# Patient Record
Sex: Female | Born: 1940 | Race: Black or African American | Hispanic: No | Marital: Single | Smoking: Never smoker
Health system: Southern US, Community
[De-identification: ages and names within clinical notes are randomized; demographics above are authoritative.]

---

## 2018-06-27 ENCOUNTER — Encounter (HOSPITAL_COMMUNITY): Payer: Self-pay | Admitting: Emergency Medicine

## 2018-06-27 ENCOUNTER — Emergency Department (HOSPITAL_COMMUNITY)
Admission: EM | Admit: 2018-06-27 | Discharge: 2018-06-28 | Disposition: A | Discharge status: Other Acute Inpatient Hospital | Payer: Commercial Managed Care - PPO | Attending: Emergency Medicine | Admitting: Emergency Medicine

## 2018-06-27 DIAGNOSIS — F418 Other specified anxiety disorders: Secondary | ICD-10-CM | POA: Diagnosis present

## 2018-06-27 DIAGNOSIS — R45851 Suicidal ideations: Secondary | ICD-10-CM

## 2018-06-27 DIAGNOSIS — F410 Panic disorder [episodic paroxysmal anxiety] without agoraphobia: Secondary | ICD-10-CM | POA: Diagnosis not present

## 2018-06-27 DIAGNOSIS — Z79899 Other long term (current) drug therapy: Secondary | ICD-10-CM | POA: Diagnosis not present

## 2018-06-27 DIAGNOSIS — F322 Major depressive disorder, single episode, severe without psychotic features: Secondary | ICD-10-CM | POA: Diagnosis not present

## 2018-06-27 DIAGNOSIS — K219 Gastro-esophageal reflux disease without esophagitis: Secondary | ICD-10-CM | POA: Diagnosis not present

## 2018-06-27 LAB — URINALYSIS, ROUTINE W REFLEX MICROSCOPIC
Bacteria, UA: NONE SEEN
Bilirubin Urine: NEGATIVE
GLUCOSE, UA: NEGATIVE mg/dL
Hgb urine dipstick: NEGATIVE
KETONES UR: 20 mg/dL — AB
Nitrite: NEGATIVE
PH: 5 (ref 5.0–8.0)
Protein, ur: 30 mg/dL — AB
Specific Gravity, Urine: 1.025 (ref 1.005–1.030)

## 2018-06-27 LAB — CBC WITH DIFFERENTIAL/PLATELET
BASOS ABS: 0 10*3/uL (ref 0.0–0.1)
BASOS PCT: 0 %
Eosinophils Absolute: 0 10*3/uL (ref 0.0–0.7)
Eosinophils Relative: 0 %
HEMATOCRIT: 41.9 % (ref 36.0–46.0)
HEMOGLOBIN: 13.4 g/dL (ref 12.0–15.0)
LYMPHS PCT: 30 %
Lymphs Abs: 1.8 10*3/uL (ref 0.7–4.0)
MCH: 28.4 pg (ref 26.0–34.0)
MCHC: 32 g/dL (ref 30.0–36.0)
MCV: 88.8 fL (ref 78.0–100.0)
MONO ABS: 0.5 10*3/uL (ref 0.1–1.0)
Monocytes Relative: 9 %
NEUTROS ABS: 3.8 10*3/uL (ref 1.7–7.7)
NEUTROS PCT: 61 %
Platelets: 294 10*3/uL (ref 150–400)
RBC: 4.72 MIL/uL (ref 3.87–5.11)
RDW: 13.9 % (ref 11.5–15.5)
WBC: 6.2 10*3/uL (ref 4.0–10.5)

## 2018-06-27 LAB — COMPREHENSIVE METABOLIC PANEL
ALBUMIN: 4.1 g/dL (ref 3.5–5.0)
ALT: 16 U/L (ref 0–44)
AST: 25 U/L (ref 15–41)
Alkaline Phosphatase: 70 U/L (ref 38–126)
Anion gap: 10 (ref 5–15)
BILIRUBIN TOTAL: 0.8 mg/dL (ref 0.3–1.2)
BUN: 14 mg/dL (ref 8–23)
CO2: 29 mmol/L (ref 22–32)
CREATININE: 0.74 mg/dL (ref 0.44–1.00)
Calcium: 9.7 mg/dL (ref 8.9–10.3)
Chloride: 104 mmol/L (ref 98–111)
GFR calc Af Amer: >60 mL/min (ref >60)
GLUCOSE: 106 mg/dL — AB (ref 70–99)
POTASSIUM: 3.5 mmol/L (ref 3.5–5.1)
Sodium: 143 mmol/L (ref 135–145)
TOTAL PROTEIN: 7.4 g/dL (ref 6.5–8.1)

## 2018-06-27 LAB — RAPID URINE DRUG SCREEN, HOSP PERFORMED
Amphetamines: NOT DETECTED
Benzodiazepines: NOT DETECTED
COCAINE: NOT DETECTED
Opiates: NOT DETECTED
TETRAHYDROCANNABINOL: NOT DETECTED

## 2018-06-27 LAB — ETHANOL: Alcohol, Ethyl (B): <10 mg/dL (ref <10)

## 2018-06-27 LAB — CBG MONITORING, ED: Glucose-Capillary: 106 mg/dL — ABNORMAL HIGH (ref 70–99)

## 2018-06-27 MED ORDER — HYDROXYZINE HCL 25 MG PO TABS: 25.0000 mg | ORAL_TABLET | Freq: Four times a day (QID) | ORAL | Status: DC | PRN

## 2018-06-27 NOTE — ED Notes (Signed)
EDP at bedside  

## 2018-06-27 NOTE — BH Assessment (Signed)
[facility] Assessment Progress Note      [facility] for review.

## 2018-06-27 NOTE — ED Notes (Signed)
Visitor at bedside.

## 2018-06-27 NOTE — ED Notes (Signed)
Specimen cup provided

## 2018-06-27 NOTE — ED Notes (Signed)
Report given to Cynthia,RN in [facility].

## 2018-06-27 NOTE — ED Notes (Signed)
Pt to room #28. Pt pleasant on approach. Pt reports increase in anxiety and nervousness. Pt reports this has been ongoing for the past two months. encouragement and support provided. Will continue to monitor.

## 2018-06-27 NOTE — ED Notes (Signed)
TTS at bedside. 

## 2018-06-27 NOTE — ED Provider Notes (Signed)
[facility]-EMERGENCY DEPT Provider Note   CSN: [id number] Arrival date & time: 06/27/18  1311     History   Chief Complaint Chief Complaint  Patient presents with  . Panic Attack    HPI Kristen Hodges is a 77 y.o. female.  This is a 77 year old female presents with several months of increasing anxiety and panic attacks.  Also notes worsening depression.  Patient also has noted hand tremors when she becomes anxious.  These are relieved when she prays.  States that she has lots of issues going on at home.  Denies any current suicidal or homicidal ideations.  She has not been hallucinating.  Has no prior psychiatric history.  Notes a decreased oral intake but good sleep.  Denies any use of alcohol or tobacco.  Symptoms have been gradually worse over the past few weeks.  No treatment used prior to arrival     History reviewed. No pertinent past medical history.  There are no active problems to display for this patient.   History reviewed. No pertinent surgical history.   OB History   None      Home Medications    Prior to Admission medications   Medication Sig Start Date End Date Taking? Authorizing Provider  Chlorpheniramine Maleate (ALLERGY PO) Take 1 tablet by mouth daily.   Yes [provider]  esomeprazole (NEXIUM) 20 MG capsule Take 20 mg by mouth daily at 12 noon.   Yes [provider]  OVER THE COUNTER MEDICATION Place 1 drop into both eyes daily as needed (ITCHY EYES).   Yes [provider]    Family History No family history on file.  Social History Social History   Tobacco Use  . Smoking status: Never Smoker  . Smokeless tobacco: Never Used  Substance Use Topics  . Alcohol use: Never    Frequency: Never  . Drug use: Never     Allergies   Patient has no allergy information on record.   Review of Systems Review of Systems  All other systems reviewed and are negative.    Physical Exam Updated  Vital Signs BP (!) 109/57 (BP Location: Left Arm)   Pulse 87   Temp (!) 94.2 F (34.6 C) (Oral)   Resp 18   SpO2 97%   Physical Exam  Constitutional: She is oriented to person, place, and time. She appears well-developed and well-nourished.  Non-toxic appearance. No distress.  HENT:  Head: Normocephalic and atraumatic.  Eyes: Pupils are equal, round, and reactive to light. Conjunctivae, EOM and lids are normal.  Neck: Normal range of motion. Neck supple. No tracheal deviation present. No thyroid mass present.  Cardiovascular: Normal rate, regular rhythm and normal heart sounds. Exam reveals no gallop.  No murmur heard. Pulmonary/Chest: Effort normal and breath sounds normal. No stridor. No respiratory distress. She has no decreased breath sounds. She has no wheezes. She has no rhonchi. She has no rales.  Abdominal: Soft. Normal appearance and bowel sounds are normal. She exhibits no distension. There is no tenderness. There is no rebound and no CVA tenderness.  Musculoskeletal: Normal range of motion. She exhibits no edema or tenderness.  Neurological: She is alert and oriented to person, place, and time. She has normal strength. No cranial nerve deficit or sensory deficit. GCS eye subscore is 4. GCS verbal subscore is 5. GCS motor subscore is 6.  Skin: Skin is warm and dry. No abrasion and no rash noted.  Psychiatric: She has a normal mood  and affect. Her speech is normal and behavior is normal. She expresses no suicidal plans and no homicidal plans.  Nursing note and vitals reviewed.    ED Treatments / Results  Labs (all labs ordered are listed, but only abnormal results are displayed) Labs Reviewed  URINE CULTURE  ETHANOL  RAPID URINE DRUG SCREEN, HOSP PERFORMED  CBC WITH DIFFERENTIAL/PLATELET  COMPREHENSIVE METABOLIC PANEL  URINALYSIS, ROUTINE W REFLEX MICROSCOPIC  CBG MONITORING, ED    EKG None  Radiology No results found.  Procedures Procedures (including critical  care time)  Medications Ordered in ED Medications - No data to display   Initial Impression / Assessment and Plan / ED Course  I have reviewed the triage vital signs and the nursing notes.  Pertinent labs & imaging results that were available during my care of the patient were reviewed by me and considered in my medical decision making (see chart for details).     Patient medically clear for psychiatric disposition  Final Clinical Impressions(s) / ED Diagnoses   Final diagnoses:  None    ED Discharge Orders    None       Lorre Nick, MD 06/27/18 1529

## 2018-06-27 NOTE — ED Triage Notes (Signed)
Patient here from home with complaints of increased panic attacks. States all of these attacks started 2 weeks ago and increased. Shaky. Reports that "I just pray and sometimes it get better".

## 2018-06-27 NOTE — ED Notes (Signed)
Pt sleeping at present, no distress noted, calm & cooperative at present.  Pt depressed at present.  A&O x 3, monitoring for safety, Q 15 min checks in effect.

## 2018-06-27 NOTE — BH Assessment (Addendum)
Assessment Note  Kristen Hodges is an [age] y.o. female who presented in the ED seeking help for her anxiety and depression.  Patient states that for the last year that she has been psychiatrically decompensating and states that she has not been able to rebound.  Patient states that she tends to get really stressed out and states that she holds things in and does not ask for help.  She states that her anxiety has been so high that she is constantly tremulous and states that she is drained and has no energy.  Patient states that she feels like it started last year when she was sick and had a persistent cough that would not go away.  At the same time, she states that she was sitting with an Alzheimer's patient and states that she allowed herself to get really stressed out.  Patient states that she "pushed things back in my mind and tried not to deal with it."  Patient states that she has been so down lately that she has prayed to die because she does not want to continue to have to live feeling this way.  Patient states that she has a difficult time mustering up enough energy to do anything and she states that she just does not feel normal anymore.  Patient lives alone and has a lot of property to take care of and she feels overwhelmed.  She states that she has a daughter who would most likely help her, but patient states that she does not want to bother her.  However, at the same time, by not getting help with things she states that she is becoming increasingly depressed. Patient states that she has lost her appetite and states that she has lost 20 lbs in the past few months.  She states that she is only sleeping four hours at a time.  While patient states that she does not have a blatant plan of how she would kill herself, she does state that she does not feel safe to leave the hospital and return home at present. She denies HI/Psychosis.  Patient states that she is divorced and states that she has one grown  daughter.  Patient states that she has a cousin that she is close to and states that her cousin keeps close tabs on her and she states, "we are close like sisters."  Kristen Hodges was in the room with patient with patient's permission and her cousin states that she has noticed a dramatic change in the patient recently and she feels like the patient is declining.  She thinks that the patient needs help with her depression.  Patient states that she has never had any mental illness treatment on an inpatient or outpatient basis.  Patient denies any history of substance use, but states that she feels like her brother was an alcoholic.  Patient denies any history of abuse and states that she has never been a self-mutilator.  Patient presents as oriented and alert.  Her memory appears to be intact.  She is pleasant and cooperative.  Her speech is clear, coherent.  Her thoughts are logical, but she appears to have some thought blocking.  Her anxiety is moderated to severe and her affect is flat.  She appears to be depressed and anxious and has a tremulous.  Her  Psycho-motor activity is restless.  She does not appear to be responding to any internal stimuli.  Patient states that she has just been feeling hopeless and helpless most recently and states  that she needs help to get her life back on track.       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 No pertinent past medical history.  History reviewed. No pertinent surgical history.  Family History: No family history on file.  Social History:  reports that she has never smoked. She has never used smokeless tobacco. She reports that she does not drink alcohol or use drugs.  Additional Social History:  Alcohol / Drug Use Pain Medications: denies Prescriptions: denies Over the Counter: denies History of alcohol / drug use?: No history of alcohol / drug abuse  CIWA: CIWA-Ar BP: 130/84 Pulse  Rate: 70 COWS:    Allergies: Not on File  Home Medications:  (Not in a hospital admission)  OB/GYN Status:  No LMP recorded. Patient is postmenopausal.  General Assessment Data Location of Assessment: [facility] ED TTS Assessment: In system Is this a Tele or Face-to-Face Assessment?: Face-to-Face Is this an Initial Assessment or a Re-assessment for this encounter?: Initial Assessment Marital status: Divorced Maiden name: Tiburcio Pea Is patient pregnant?: No Pregnancy Status: No Living Arrangements: Alone Can pt return to current living arrangement?: Yes Admission Status: Voluntary Is patient capable of signing voluntary admission?: Yes Referral Source: Self/Family/Friend Insurance type: Actor)     Crisis Care Plan Living Arrangements: Alone Legal Guardian: Other:(self) Name of Psychiatrist: (none) Name of Therapist: (none)  Education Status Is patient currently in school?: No Is the patient employed, unemployed or receiving disability?: Receiving disability income(retired)  Risk to self with the past 6 months Suicidal Ideation: Yes-Currently Present Has patient been a risk to self within the past 6 months prior to admission? : No Suicidal Intent: No Has patient had any suicidal intent within the past 6 months prior to admission? : No Is patient at risk for suicide?: Yes(patient states that she has been praying to die.) Suicidal Plan?: No Has patient had any suicidal plan within the past 6 months prior to admission? : No Access to Means: No What has been your use of drugs/alcohol within the last 12 months?: (none) Previous Attempts/Gestures: No How many times?: (0) Other Self Harm Risks: (lives alone, minimal support, high anxiety) Triggers for Past Attempts: None known Intentional Self Injurious Behavior: None Family Suicide History: No Recent stressful life event(s): Other (Comment)(general life changes as you get older) Persecutory voices/beliefs?: No Depression:  Yes Depression Symptoms: Despondent, Tearfulness, Isolating, Fatigue, Loss of interest in usual pleasures, Feeling worthless/self pity Substance abuse history and/or treatment for substance abuse?: No Suicide prevention information given to non-admitted patients: Not applicable  Risk to Others within the past 6 months Homicidal Ideation: No Does patient have any lifetime risk of violence toward others beyond the six months prior to admission? : No Thoughts of Harm to Others: No Current Homicidal Intent: No Current Homicidal Plan: No Access to Homicidal Means: No Identified Victim: none History of harm to others?: No Assessment of Violence: None Noted Violent Behavior Description: none Does patient have access to weapons?: No Criminal Charges Pending?: No Does patient have a court date: No Is patient on probation?: No  Psychosis Hallucinations: None noted Delusions: None noted  Mental Status Report Appearance/Hygiene: Unremarkable Eye Contact: Good Motor Activity: Tremors Speech: Logical/coherent Level of Consciousness: Alert Mood: Depressed, Anxious Affect: Anxious, Depressed Anxiety Level: Severe Thought Processes: Coherent, Relevant Judgement: Impaired Orientation: Person, Place, Time, Situation Obsessive Compulsive Thoughts/Behaviors: None  Cognitive Functioning Concentration: Normal Memory: Recent Intact, Remote Intact Is patient IDD:  No Is patient DD?: No Insight: Fair Impulse Control: Good Appetite: Poor Have you had any weight changes? : Loss Amount of the weight change? (lbs): 20 lbs Sleep: Decreased Total Hours of Sleep: 4 Vegetative Symptoms: None  ADLScreening [facility] Assessment Services) Patient's cognitive ability adequate to safely complete daily activities?: Yes Patient able to express need for assistance with ADLs?: Yes Independently performs ADLs?: Yes (appropriate for developmental age)  Prior Inpatient Therapy Prior Inpatient Therapy:  No  Prior Outpatient Therapy Prior Outpatient Therapy: No Does patient have an ACCT team?: No Does patient have Intensive In-House Services?  : No Does patient have [facility] services? : No Does patient have P4CC services?: No  ADL Screening (condition at time of admission) Patient's cognitive ability adequate to safely complete daily activities?: Yes Is the patient deaf or have difficulty hearing?: No Does the patient have difficulty seeing, even when wearing glasses/contacts?: No Does the patient have difficulty concentrating, remembering, or making decisions?: No Patient able to express need for assistance with ADLs?: Yes Does the patient have difficulty dressing or bathing?: No Independently performs ADLs?: Yes (appropriate for developmental age) Does the patient have difficulty walking or climbing stairs?: Yes Weakness of Legs: None Weakness of Arms/Hands: None     Therapy Consults (therapy consults require a physician order) PT Evaluation Needed: No OT Evalulation Needed: No SLP Evaluation Needed: No Abuse/Neglect Assessment (Assessment to be complete while patient is alone) Abuse/Neglect Assessment Can Be Completed: Yes Physical Abuse: Denies Verbal Abuse: Denies Sexual Abuse: Denies Exploitation of patient/patient's resources: Denies Self-Neglect: Denies Values / Beliefs Cultural Requests During Hospitalization: None Spiritual Requests During Hospitalization: None Consults Spiritual Care Consult Needed: No Social Work Consult Needed: No Merchant navy officer (For Healthcare) Does Patient Have a Medical Advance Directive?: Yes Type of Advance Directive: Living will, Healthcare Power of [employer] of Healthcare Power of Attorney in Chart?: No - copy requested Copy of Living Will in Chart?: No - copy requested Nutrition Screen- [facility] Adult/[facility] Has the patient recently lost weight without trying?: Yes, 14-23 lbs. Has the patient been eating poorly because of a  decreased appetite?: Yes Malnutrition Screening Tool Score: 3  Additional Information 1:1 In Past 12 Months?: No CIRT Risk: No Elopement Risk: No Does patient have medical clearance?: Yes     Disposition: Per Nanine Means, DNP, inpaient gero-psych placement is recommended. Disposition Initial Assessment Completed for this Encounter: Yes Disposition of Patient: Admit Type of inpatient treatment program: Adult  On Site Evaluation by:   Reviewed with Physician:    Arnoldo Lenis Talia Hoheisel 06/27/2018 5:44 PM

## 2018-06-28 ENCOUNTER — Emergency Department (HOSPITAL_COMMUNITY): Payer: Commercial Managed Care - PPO

## 2018-06-28 DIAGNOSIS — R45851 Suicidal ideations: Secondary | ICD-10-CM

## 2018-06-28 DIAGNOSIS — F418 Other specified anxiety disorders: Secondary | ICD-10-CM | POA: Diagnosis not present

## 2018-06-28 DIAGNOSIS — F410 Panic disorder [episodic paroxysmal anxiety] without agoraphobia: Secondary | ICD-10-CM | POA: Diagnosis not present

## 2018-06-28 LAB — TSH: TSH: 0.65 u[IU]/mL (ref 0.350–4.500)

## 2018-06-28 MED ORDER — PANTOPRAZOLE SODIUM 40 MG PO TBEC
40.0000 mg | DELAYED_RELEASE_TABLET | Freq: Every day | ORAL | Status: DC
  Administered 2018-06-28: 40 mg via ORAL
  Filled 2018-06-28: qty 1

## 2018-06-28 NOTE — Consult Note (Addendum)
[facility] Consult   Reason for Consult:  Severe anxiety Referring Physician:  EDP Patient Identification: Kristen Hodges MRN:  [id number] Principal Diagnosis: Depression with anxiety Diagnosis:   Patient Active Problem List   Diagnosis Date Noted  . Depression with anxiety [F41.8] 06/28/2018    Total Time spent with patient: 45 minutes  Subjective:   Kristen Hodges is a 77 y.o. female patient admitted with severe anxiety.  HPI:  Pt was seen and chart reviewed with treatment team and Dr Mariea Clonts. Pt stated she has had severe anxiety and panic attacks for the past month and the attacks just come out of nowhere. Pt stated she has never had anything like this before. Pt denies suicidal ideation and auditory/visual hallucinations but when asked if she wanted to harm anyone else she stated she did not know how to answer that one. Pt is calm and cooperative but did endorse multiple people she is close to have passed away recently. Pt lives by herself and has one daughter who helps her when needed. Pt takes no medications for any mental health needs and only takes Nexium for acid reflux. Pt does have a history of heart problems but due to her being from [place], [place] there are no EKG's to compare to. 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 All of Pt's other lab work is unremarkable. Will order TSH to rule out Thyroid issues that may be causing some of her anxiety. Pt is recommended for inpatient geropsychiatric admission to address her anxiety and depression. Pt is agreeable to this plan and has been accepted to [facility] by Dr Alonna Minium.   Past Psychiatric History: As above  Risk to Self: Suicidal Ideation: Yes-Currently Present Suicidal Intent: No Is patient at risk for suicide?: Yes(patient states that she has been praying to die.) Suicidal Plan?: No Access to Means: No What has been your use of  drugs/alcohol within the last 12 months?: (none) How many times?: (0) Other Self Harm Risks: (lives alone, minimal support, high anxiety) Triggers for Past Attempts: None known Intentional Self Injurious Behavior: None Risk to Others: Homicidal Ideation: No Thoughts of Harm to Others: No Current Homicidal Intent: No Current Homicidal Plan: No Access to Homicidal Means: No Identified Victim: none History of harm to others?: No Assessment of Violence: None Noted Violent Behavior Description: none Does patient have access to weapons?: No Criminal Charges Pending?: No Does patient have a court date: No Prior Inpatient Therapy: Prior Inpatient Therapy: No Prior Outpatient Therapy: Prior Outpatient Therapy: No Does patient have an ACCT team?: No Does patient have Intensive In-House Services?  : No Does patient have [facility] services? : No Does patient have P4CC services?: No  Past Medical History: History reviewed. No pertinent past medical history. History reviewed. No pertinent surgical history. Family History: No family history on file. Family Psychiatric  History: Son-BPAD Social History:  Social History   Substance and Sexual Activity  Alcohol Use Never  . Frequency: Never     Social History   Substance and Sexual Activity  Drug Use Never    Social History   Socioeconomic History  . Marital status: Single    Spouse name: Not on file  . Number of children: Not on file  . Years of education: Not on file  . Highest education level: Not on file  Occupational History  . Not on file  Social Needs  . Financial  resource strain: Not on file  . Food insecurity:    Worry: Not on file    Inability: Not on file  . Transportation needs:    Medical: Not on file    Non-medical: Not on file  Tobacco Use  . Smoking status: Never Smoker  . Smokeless tobacco: Never Used  Substance and Sexual Activity  . Alcohol use: Never    Frequency: Never  . Drug use: Never  . Sexual  activity: Not on file  Lifestyle  . Physical activity:    Days per week: Not on file    Minutes per session: Not on file  . Stress: Not on file  Relationships  . Social connections:    Talks on phone: Not on file    Gets together: Not on file    Attends religious service: Not on file    Active member of club or organization: Not on file    Attends meetings of clubs or organizations: Not on file    Relationship status: Not on file  Other Topics Concern  . Not on file  Social History Narrative  . Not on file   Additional Social History: She lives at home alone. She has one daughter and two grandsons. She denies illicit substance or alcohol use.     Allergies:  Not on File  Labs:  Results for orders placed or performed during the hospital encounter of 06/27/18 (from the past 48 hour(s))  POC CBG, ED     Status: Abnormal   Collection Time: 06/27/18  2:15 PM  Result Value Ref Range   Glucose-Capillary 106 (H) 70 - 99 mg/dL  Ethanol     Status: None   Collection Time: 06/27/18  2:35 PM  Result Value Ref Range   Alcohol, Ethyl (B) <10 <10 mg/dL    Comment: (NOTE) Lowest detectable limit for serum alcohol is 10 mg/dL. For medical purposes only. Performed at [facility], [place] [address] [place], [place] [address]   CBC with Differential/Platelet     Status: None   Collection Time: 06/27/18  2:35 PM  Result Value Ref Range   WBC 6.2 4.0 - 10.5 K/uL   RBC 4.72 3.87 - 5.11 MIL/uL   Hemoglobin 13.4 12.0 - 15.0 g/dL   HCT 41.9 36.0 - 46.0 %   MCV 88.8 78.0 - 100.0 fL   MCH 28.4 26.0 - 34.0 pg   MCHC 32.0 30.0 - 36.0 g/dL   RDW 13.9 11.5 - 15.5 %   Platelets 294 150 - 400 K/uL   Neutrophils Relative % 61 %   Neutro Abs 3.8 1.7 - 7.7 K/uL   Lymphocytes Relative 30 %   Lymphs Abs 1.8 0.7 - 4.0 K/uL   Monocytes Relative 9 %   Monocytes Absolute 0.5 0.1 - 1.0 K/uL   Eosinophils Relative 0 %   Eosinophils Absolute 0.0 0.0 - 0.7 K/uL   Basophils Relative 0 %    Basophils Absolute 0.0 0.0 - 0.1 K/uL    Comment: Performed at [facility], [place] [address] [place],  [address]  Comprehensive metabolic panel     Status: Abnormal   Collection Time: 06/27/18  2:35 PM  Result Value Ref Range   Sodium 143 135 - 145 mmol/L   Potassium 3.5 3.5 - 5.1 mmol/L   Chloride 104 98 - 111 mmol/L    Comment: Please note change in reference range.   CO2 29 22 - 32 mmol/L   Glucose, Bld  106 (H) 70 - 99 mg/dL    Comment: Please note change in reference range.   BUN 14 8 - 23 mg/dL    Comment: Please note change in reference range.   Creatinine, Ser 0.74 0.44 - 1.00 mg/dL   Calcium 9.7 8.9 - 10.3 mg/dL   Total Protein 7.4 6.5 - 8.1 g/dL   Albumin 4.1 3.5 - 5.0 g/dL   AST 25 15 - 41 U/L   ALT 16 0 - 44 U/L    Comment: Please note change in reference range.   Alkaline Phosphatase 70 38 - 126 U/L   Total Bilirubin 0.8 0.3 - 1.2 mg/dL   GFR calc non Af Amer >60 >60 mL/min   GFR calc Af Amer >60 >60 mL/min    Comment: (NOTE) The eGFR has been calculated using the CKD EPI equation. This calculation has not been validated in all clinical situations. eGFR's persistently <60 mL/min signify possible Chronic Kidney Disease.    Anion gap 10 5 - 15    Comment: Performed at [facility], [place] [address] [place], [place] [address]  Rapid urine drug screen (hospital performed)     Status: Abnormal   Collection Time: 06/27/18  3:19 PM  Result Value Ref Range   Opiates NONE DETECTED NONE DETECTED   Cocaine NONE DETECTED NONE DETECTED   Benzodiazepines NONE DETECTED NONE DETECTED   Amphetamines NONE DETECTED NONE DETECTED   Tetrahydrocannabinol NONE DETECTED NONE DETECTED   Barbiturates (A) NONE DETECTED    Result not available. Reagent lot number recalled by manufacturer.    Comment: Performed at [facility], [place] [address], [place], [place] [address]  Urinalysis, Routine w reflex microscopic     Status:  Abnormal   Collection Time: 06/27/18  3:19 PM  Result Value Ref Range   Color, Urine YELLOW YELLOW   APPearance HAZY (A) CLEAR   Specific Gravity, Urine 1.025 1.005 - 1.030   pH 5.0 5.0 - 8.0   Glucose, UA NEGATIVE NEGATIVE mg/dL   Hgb urine dipstick NEGATIVE NEGATIVE   Bilirubin Urine NEGATIVE NEGATIVE   Ketones, ur 20 (A) NEGATIVE mg/dL   Protein, ur 30 (A) NEGATIVE mg/dL   Nitrite NEGATIVE NEGATIVE   Leukocytes, UA TRACE (A) NEGATIVE   RBC / HPF 6-10 0 - 5 RBC/hpf   WBC, UA 11-20 0 - 5 WBC/hpf   Bacteria, UA NONE SEEN NONE SEEN   Squamous Epithelial / LPF 0-5 0 - 5   Mucus PRESENT     Comment: Performed at [facility], [place] [address], [place],  [address]    Current Facility-Administered Medications  Medication Dose Route Frequency Provider Last Rate Last Dose  . hydrOXYzine (ATARAX/VISTARIL) tablet 25 mg  25 mg Oral Q6H PRN Patrecia Pour, NP      . pantoprazole (PROTONIX) EC tablet 40 mg  40 mg Oral Daily Ethelene Hal, NP   40 mg at 06/28/18 0945   Current Outpatient Medications  Medication Sig Dispense Refill  . Chlorpheniramine Maleate (ALLERGY PO) Take 1 tablet by mouth daily.    Marland Kitchen esomeprazole (NEXIUM) 20 MG capsule Take 20 mg by mouth daily at 12 noon.    Marland Kitchen OVER THE COUNTER MEDICATION Place 1 drop into both eyes daily as needed (ITCHY EYES).      Musculoskeletal: Strength & Muscle Tone: within normal limits Gait & Station: normal Patient leans: N/A  Psychiatric Specialty Exam: Physical Exam  Nursing note and vitals reviewed.  Constitutional: She is oriented to person, place, and time. She appears well-developed and well-nourished.  HENT:  Head: Normocephalic and atraumatic.  Neck: Normal range of motion.  Respiratory: Effort normal.  Musculoskeletal: Normal range of motion.  Neurological: She is alert and oriented to person, place, and time.  Psychiatric: Her speech is normal and behavior is normal. Thought content normal.  Her mood appears anxious. Cognition and memory are normal. She expresses impulsivity. She exhibits a depressed mood.    Review of Systems  Psychiatric/Behavioral: Positive for depression. Negative for hallucinations, memory loss, substance abuse and suicidal ideas. The patient is nervous/anxious. The patient does not have insomnia.   All other systems reviewed and are negative.   Blood pressure 130/62, pulse 66, temperature 98.1 F (36.7 C), temperature source Oral, resp. rate 12, SpO2 99 %.There is no height or weight on file to calculate BMI.  General Appearance: Casual  Eye Contact:  Good  Speech:  Clear and Coherent and Normal Rate  Volume:  Normal  Mood:  Anxious and Depressed  Affect:  Congruent and Depressed  Thought Process:  Coherent, Linear and Descriptions of Associations: Intact  Orientation:  Full (Time, Place, and Person)  Thought Content:  Logical  Suicidal Thoughts:  No  Homicidal Thoughts:  Pt stated she did not know how to answer this question  Memory:  Immediate;   Good Recent;   Good Remote;   Fair  Judgement:  Fair  Insight:  Fair  Psychomotor Activity:  Normal  Concentration:  Concentration: Good and Attention Span: Good  Recall:  [place] of Knowledge:  Good  Language:  Good  Akathisia:  No  Handed:  Right  AIMS (if indicated):   N/A  Assets:  Communication Skills Desire for Improvement Financial Resources/Insurance Housing Social Support  ADL's:  Intact  Cognition:  WNL  Sleep:   Poor     Treatment Plan Summary: Daily contact with patient to assess and evaluate symptoms and progress in treatment and Medication management -TSH ordered and WNL (0.650).   Disposition: Recommend psychiatric Inpatient admission when medically cleared. Pt has been accepted to [facility] for [facility]  Ethelene Hal, NP 06/28/2018 11:56 AM   Patient seen face-to-face for psychiatric evaluation, chart reviewed and  case discussed with the physician extender and developed treatment plan. Reviewed the information documented and agree with the treatment plan.  Buford Dresser, DO 06/28/18 9:26 PM

## 2018-06-28 NOTE — BH Assessment (Signed)
[facility]  Per Juanetta BeetsJacqueline Norman, DO, this pt requires psychiatric hospitalization at this time.  At 11:51 Melissa calls [facility].  Pt has been accepted to their facility by Dr Joseph ArtSubedi to Rm [id number].  Dr Sharma CovertNorman, concurs with this disposition, as does the pt who is currently under voluntary status.  Pt has signed [facility]'s consent for admission and treatment form.  The signed form has been faxed to [facility], and Efraim KaufmannMelissa has confirmed receipt.  Pt's nurse, Aram BeechamCynthia, has been notified, and agrees to call report to [phone].  Pt is to be transported via [facility], along with original signed form.  Doylene Canninghomas Nel Stoneking, [place] Behavioral Health Coordinator [phone]

## 2018-06-28 NOTE — ED Notes (Signed)
Patient calm and cooperative, visiting with family.  Ate small amount of breakfast and slept well last night.  Patient reports the feelings she is having came upon her suddenly, that she had been fine one week ago working in her yard and generally doing well.  She reports her son died a few years ago and she did not properly grieve at the time, and was wondering if that could have contributed to her condition.  She normally lives alone, but a daughter has been staying with her this week.  Her family and friends from church are very supportive. Her son had bipolar disorder according to patient. Patient is maintaining her hygiene well, reports no active suicidal ideation but has had fleeting thoughts of going to sleep and not waking up.  She did not answer the question when asked if she was having homicidal ideation. She denies audio and visual hallucinations.

## 2018-06-28 NOTE — ED Notes (Signed)
Report called to [facility], Melissa Goley RN.  [facility] called for transport.

## 2018-06-30 LAB — URINE CULTURE: CULTURE: NO GROWTH

## 2019-01-13 IMAGING — DX DG CHEST 1V PORT
1 series · 1 of 1 positions shown · non-contrast
Comparison: None.

CLINICAL DATA: Patient for psychiatric admission.  Evaluation.

EXAM:
PORTABLE CHEST 1 VIEW

[chest ap]
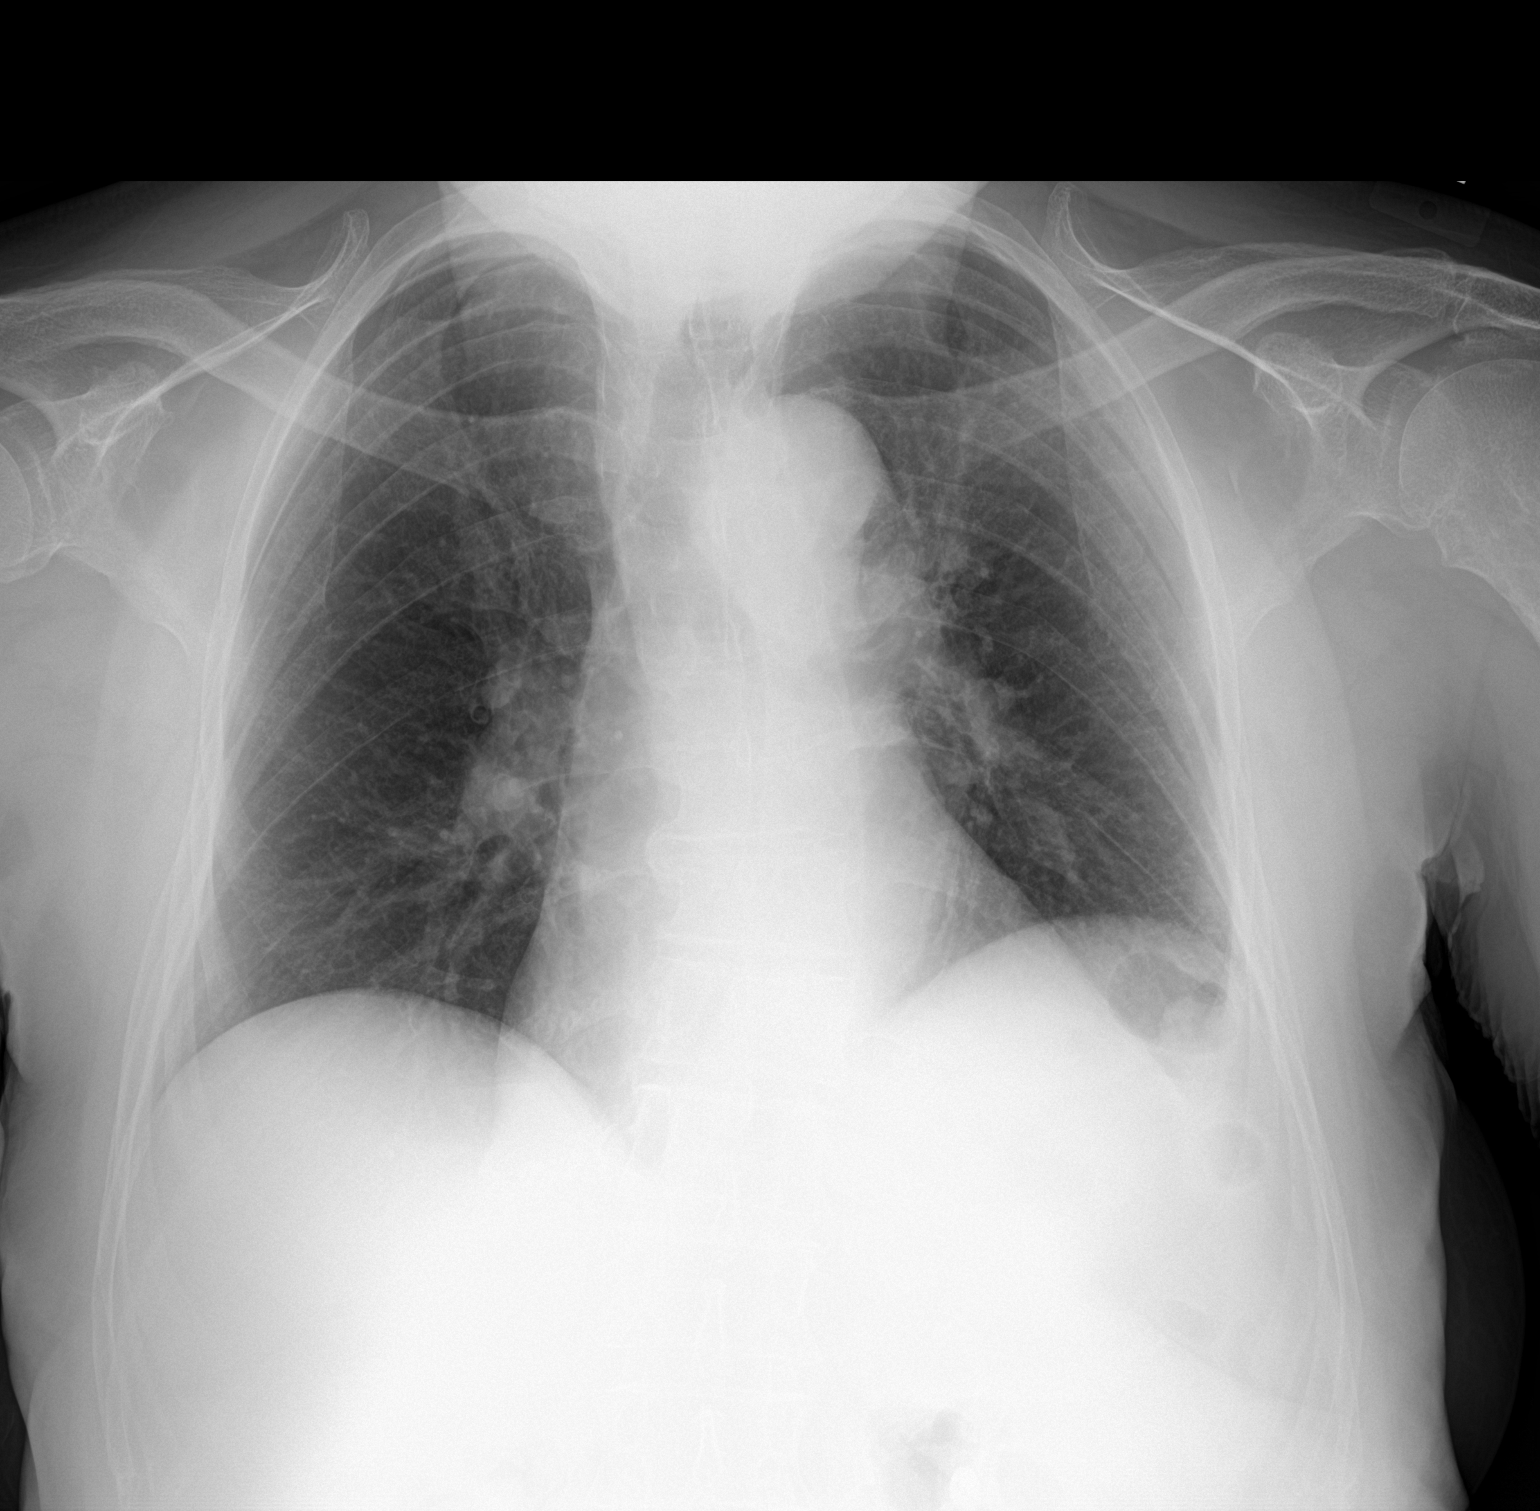

[1 of 1 positions shown; findings below may reference images not displayed]

FINDINGS: Normal scratch the cardiac contours upper limits of normal. No large
area pulmonary consolidation. No pleural effusion or pneumothorax.
IMPRESSION: Cardiac contours upper limits of normal, potentially secondary to
portable technique.

No acute cardiopulmonary process.
